# Patient Record
Sex: Female | Born: 1950 | Race: White | Marital: Married
Health system: Northeastern US, Academic
[De-identification: ages and names within clinical notes are randomized; demographics above are authoritative.]

## PROBLEM LIST (undated history)

## (undated) NOTE — Progress Notes (Signed)
Formatting of this note is different from the original.  Images from the original note were not included.  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 Video visit was offered and links provided, but the patient preferred telephone only.     Interval History:  Rachel Mejia is a 31 y.o. female who continues to be followed for a lung cancer resected August 2021. She has completely recovered from surgery. From May to October, Rachel Mejia travelled through [place] ([place], [place] etc[place] and [place]  She did have some shortness of breath at elevation. She is now back in [place] until the spring. She has been well and the only new medical issue has been worsening osteoporosis. She is managed by Dr. Alphonsa Mejia her PCP.     Physical Examination:  There were no vitals taken for this visit.  This was telehealth.    Interval Investigations:    Imaging:     CT Chest 09/17/2022    Impression:   55 y.o. 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     Recommendations:     1. CT Chest in 1 year    I spent more than 20 minutes in counseling and coordination of care, with more than 50% of this time in direct, face to face, discussion with the patient.    Rachel Pitt MD  Thoracic Surgery  Electronically signed by Rachel Bene, MD at 10/05/2022  4:56 PM CDT

---

## 2021-11-25 NOTE — Progress Notes (Signed)
Physical Therapy Treatment Note    Patient Name: Rachel Mejia  UJWJX'B Date: 11/25/2021  Time in: 0753     Time out: 0837   Patient found:  (Received from waiting area)      Patient Effort: Patient Effort: Motivated  Precautions:     Precautions  Other Precautions: L hemiplegia, High fall risk, No sensation on L UE and L LE  Subjective: Pt received from waiting area with no voiced complaints.  She reports compliance with HEP.  Missed visits: 0  Vitals: There were no vitals filed for this visit.  Endurance: Activity Tolerance  Endurance: Endurance does not limit participation in activity  Pain  Pain Assessment  Pain Assessment: 0-10  Pain Score (0-10 Scale): 0 - No pain    Neurological:  Cognitive Status  Overall Cognitive Status: WFL    Gait:  Pt ambulatory in and out of facility with NBQC wearing articulating AFO on L.  She demonstrated good stride length and initial heel strike.     Exercises:  Pt was seen for there ex for improved L LE strength for functional mobility including:  Recumbent cycle L 1 x 7 minutes  STS x 10 without UE assist and x 10 with L LE staggered in back  Four inch step ups x 10 with L LE and B UE assist  Four inch toe taps with L LE 2 x 10  Hip abd with green t-band 3 x 10  Hip adductor ball squeezes 3 x 10  HSC with yellow t-band x 10  Seated hip flexion x 10  LAQs 2 x 10  Pt required verbal cueing for sustained contraction throughout ROM and for eccentric control  Assessment: Pt is highly motivated and maximizes effort.  Pt educated in rationale for various exercises.  She verbalized understanding.  Plan: Continue per POC    Discharge Recommendations:   HEP for self management  Outpatient Medication reconciliation completed? : Yes  Charges: PT Charges  PT Billing Modifiers: Treatment performed 100% by Therapist, no modifier added/needed  $ PT Therapeutic Exercise: 3  Therapist: Caroline More, PT  11/25/21

## 2022-01-03 NOTE — H&P (Signed)
Eudelia, Cahoon    78 Y  old  Female, DOB: October 15, 1951    [address], [place] [address]    Home: [phone]       Provider: Malva Limes, MD         Telephone Encounter          Answered by   Lillie Columbia Date: 05/13/2018       Time: 11:52 AM           Reason  CT neck         Notes    Patient scheduled for CT neck soft tissue w/contrast at [facility] on 05/20/2018 @ 9:15 AM. No solids 4 hrs prior. May have clear liquids up to 2 hrs prior. Will need to have creatinine drawn a few day prior to testing. Creatinine order sent to [facility]. Patient notified.                        Reason for Appointment   1. CT neck            Assessments   1. Blood tests prior to treatment or procedure - Z01.812      Treatment   1. Blood tests prior to treatment or procedure         LAB: Creatinine, Serum or Plasma ([id number]) (Ordered for 05/13/2018)                                Patient: Allisandra, Stonecypher    DOB: 06-Feb-1951    Provider: Malva Limes, MD    05/13/2018      Note generated by eClinicalWorks EMR/PM Software ([email or web address])

## 2022-01-03 NOTE — H&P (Signed)
Rachel Mejia, Rachel Mejia    32 Y  old  Female, DOB: September 13, 1951    [address], [place] [address]    Home: [phone]       Provider: Malva Limes, MD         Telephone Encounter          Answered by   Malachy Moan Date: 05/16/2018       Time: 01:41 PM           Reason  CT scan         Message    Pt called said she was supposed to have a CT set up but has not heard from anyone yet. You can call her back at [phone]         Action Taken    Lillie Columbia  05/17/2018 09:07:20 AM EDT > CT neck soft tissue w/contrast at [facility] on 05/20/2018 @ 9:15 AM                                   Patient: Rachel Mejia, Rachel Mejia    DOB: 03-04-1951    Provider: Malva Limes, MD    05/16/2018      Note generated by eClinicalWorks EMR/PM Software ([email or web address])

## 2022-01-05 NOTE — Progress Notes (Signed)
Physical Therapy Treatment Note    Patient Name: Rachel Mejia  ZOXWR'U Date: 01/05/2022  Time in: 1033     Time out: 1118  Pt ambulatory into clinic with [facility] and L LE AFO   Patient Effort: Motivated  Precautions:  L hemiplegia, High fall risk, No sensation on L UE and L LE  Subjective: Pt notes she has sister in law with her on this date. She denies pain.   Missed visits: 0  Vitals: There were no vitals filed for this visit.     Endurance: Activity Tolerance  Endurance: Endurance does not limit participation in activity  Sitting Balance: Moves/returns trunkal midpoint more than 2 inches in all planes  Pain  Pain Assessment  Pain Assessment: 0-10  Pain Score (0-10 Scale): 0 - No pain  Patient's Comfort Goal: 3  Response to Interventions: Tolerated  Level of Consciousness: Alert    Neurological:  Cognitive Status  Overall Cognitive Status: WFL      Transfers:  Independent from raised surfaces, Requires use of UE from lowered surfaces  Gait:  Requires use of SBQC and L AFO     Exercises:         Seated reach to floor -->Sit<>stand--> OH Ball raise with Red ball 2x10 (from raised mat)  *w/ 1 pd weight on B UE    Standing forward press to LE step w/ red ball 2x10 on each LE   * 4 pd weight on LE     Partial sit ups 2x10 (2 bolsters height)    Ambulation inside of parallel bars with larger obstacles to increase B LE swing height and length   * trials w/ 4 pd weight on B LE (12 trips total)    Standing balance to LE tap on cones for multiple trials with R LE and L LE   *performed with 4 pd ankle weights on B LE   *roughly 2x12 performed on each LE     Standing forward press to LE step w/ red ball 2x10 on each LE   * 4 pd weight on LE     *CGA to min A (with R LE cone taps and L LE on floor for support)  required for ambulation over cones and standing balance with LE taps     Ambulation with 4 pd weight on L LE to improve swing fluidity and increase B swing height for 5 minutes     Equipment Use:  SBQC    Assessment: Pt  tolerated treatment well with no report of pain or adverse events to note. Exercises above were performed to improve functional mobility and decrease burden with transfers. Pt is now ambulating over larger obstacles with increased LE weight demonstrating improved motor control. Pt performed modified sit ups to improve trunk and hip flexor strength to improve gait and ambulation pattern specifically with increasing B LE swing height. Pt continues to require assistance for standing cone taps due to LE weakness. Pt also was able to ambulate in 5 minutes the same distance on 12/18/21, but with 4 pd ankle weights on B LE.  Pt educated on rationale on novel exercises, and pt noted understanding.     Plan: Continue per POC set at last [facility] on 12/18/21.      Discharge Recommendations:   HEP for self-management   Outpatient Medication reconciliation completed? : Yes  Charges: PT Charges  PT Billing Modifiers: Treatment performed 100% by Therapist, no modifier added/needed  $ PT Therapeutic Exercise: 2  $  Pt Therapeutic Activity: 1  Therapist: Nelva Bush, PT  01/05/22

## 2022-03-03 NOTE — Progress Notes (Signed)
Physical Therapy Treatment Note    Patient Name: Rachel Mejia  QIONG'E Date: 03/03/2022  Time in: 1338     Time out: 1424  Pt ambulatory into clinic with [facility] and L LE AFO   Patient Effort: Motivated  Precautions:  L hemiplegia, High fall risk, No sensation on L UE and L LE  Subjective: Pt notes she is ready for her travels. Pt is denies any pain.   Missed visits: 0  Vitals: There were no vitals filed for this visit.     Endurance: Activity Tolerance  Endurance: Endurance does not limit participation in activity  Sitting Balance: Moves/returns trunkal midpoint more than 2 inches in all planes  Pain  Pain Assessment  Pain Assessment: 0-10  Pain Score (0-10 Scale): 0  Patient's Comfort Goal (CFG): 3  Response to Interventions: Tolerated  Level of Consciousness: Alert    Neurological:  Cognitive Status  Overall Cognitive Status: WFL      Transfers:  Independent from raised surfaces, Requires use of UE from lowered surfaces  Gait:  Requires use of SBQC and L AFO     Exercises:      Partial sit ups 2x15 (bolster)  *3 pd weight added     Standing balance on Airex pad w/ shoulder flexion standing balance with eyes closed to Standing marches to Mini squats  2x12/15/12  *with 2.5 pd added to R UE     Standing Hip flexion w/ Green TB 2x10 (2 sets wit PT assisting L LE)    Lateral ambulation inside of parallel bars using Green TB for roughly 15 reps on each LE L/R for 2 sets (CGA provided)    Backward ambulation inside of parallel bars to emphasize TKE for 30 ft   *CGA provided    Ambulation w/ SBQC w/ 3 pd weight on L LE for 5 minutes   *pt ambulated roughly 350 ft  *close supervision provided     Equipment Use:  SBQC    Assessment: Pt tolerated treatment well with no report of pain or adverse events to note. Exercise intensity was increased on this date to improve swing height and length which pt has had noted improvements in since initial evaluation. Backward ambulation was performed  to improve TKE along with challenge balance (decreasing visual field). Pt required up to CGA for backward ambulation due to deficits in balance. Pt educated on novel exercise rationale and pt noted understanding.     Plan: Continue per POC set at last [facility] on 02/10/22.     Discharge Recommendations:   HEP for self-management   Outpatient Medication reconciliation completed? : Yes  Charges: PT Charges  PT Billing Modifiers: Treatment performed 100% by Therapist, no modifier added/needed  $ PT Therapeutic Exercise: 1 (20 min)  $ PT Therapeutic Activity: 1 (13 min)  $ PT Neuro Re-Ed: 1 (13 min)  Therapist: Nelva Bush, PT  03/03/22

## 2022-03-03 NOTE — Unmapped External Note (Signed)
Occupational Therapy Discharge Note  Today's Date: 03/03/2022  Patient Name: Rachel Mejia  [id number]  There is no problem list on file for this patient.    Ordering Physician: Alphonsa Overall , DO    Assesment  Date of Initial Visit: 12/08/2021  Date of Last Visit: 03/03/2022   Number of Attended visits: 7  Summary of Deficits:  Pt is participating with OT services, continues to displays flexor tone, fluctuating from high-moderate. Pt has 1 inch L shoulder subluxation - returned purchase sling and not wearing anything to support affected Shoulder. Pt traveling across country. Stated she will return to therapy in the Fall.  Discharged his date with no goals met     03/03/22 1258   General   OT Treatment Provided Date 03/03/22   Time in 1258   Time Out 1340   Treatment Duration (min) 42 Minutes   Response to Previous Treatment Patient with no complaints from previous session   Family/Caregiver Present No   Pain Assessment   Pain Assessment 0-10   Pain Score (0-10 Scale) 0   Patient's Comfort Goal (CFG) No pain   Special Tests   Functional Assessment Tool Used AMPAC   Score 20/24 38% functional impairment   Cognition   Arousal/Alertness WFL   Plan   Plan Alter current plan   Plan Comments disharge   Patient Left   (handed off to PT)   Recommendation   Treatment Diagnosis CVA LUE affected   Placement Recommendations No services recommended upon discharge   OT - OK to Discharge Yes   OP Med Rec Complete   Outpatient Medication reconciliation completed?  Y   OT Charges   OT Billing Modifiers Treatment performed 100% by Therapist, no modifier added/needed   $ OT Therapeutic Exercise 2   $ OT NeuroMuscular Re-Education 1     Treatment:  OT tx to focus on increased use of LUE pertaining to BADLs and compensatory strategies for increased independence.Pt presented for discussion of continuing or discharging from OT. Pt requested discharge since pt will be out of state  traveling. Therapist instructed pt to continue HEP to promote increase functional use of LEU/decrease tone.   Left Side    - PROMLUE from elbow to distal 10 reps 10 second hold each  - AROM wrist extension with 5 second for 10 reps  - AROM  attempted finger extension for each finger each jt in gravity eliminated position  - Elbow extension, 3x10 - decreased extension  - Elbow flexion 3x 10   - Scapular retracations 2x 10        Charges: OT Charges  OT Billing Modifiers: Treatment performed 100% by Therapist, no modifier added/needed  $ OT Therapeutic Exercise: 2  $ OT NeuroMuscular Re-Education: 1  Plan  Goals:   Goals:  1) Pt will ldemo increased use of LUE for 25% of daily activity per pt report.             Not Met   2) Pt will continue to report 0/10 pain LUE with increased activity.            Not Met  3) Pt will wear resting hand splint through 75% of day per pt report.              Not Met   4) Pt will decrease LUE tone from high to moderate.            Not met  Reasons for Discharge: Pt traveling. Will continue when return    Discharge Recommendations: Outpatient and No services recommended    Therapist: Dessa Phi, OT  03/03/22

## 2022-09-24 NOTE — Unmapped External Note (Signed)
Physical Therapy Evaluation   Patient Name: Rachel Mejia  Ordering Physician: Marya Landry  [id number]  PT Treatment Provided Date: 09/24/22   Today's Date: 09/24/2022  Chart Reviewed: Chart Reviewed: Yes  Time in: 1115    Time out: 1200  History of Present Illness:  Pt presents to the clinic with residual deficits from a CVA 11/29/15 and is being evaluated for a manual wheelchair which she uses for mobility primarily around her home Pt notes she also has ben limited in her mobility since around September 2017 when she fell and broke her hip.  Pt was seen by this PT from 12/22 to 3/30 in which pt did improve in overall functional mobility including short distance ambulation. . Pt is primarily uses her manual wheelchair for mobility >80% of the day throughout her home.  Past Medical History:  Cancer, Stroke, Osteoporosis, Arthritis, HTN   Rehab Potential Assessment: Good  Patient found: Propelled into clinic via manual wheelchair  Patient Effort: Motivated  Family/Caregiver Present: No    Subjective: Pt agreeable to PT evaluation.   Precautions  Other Precautions: Fall Risk   Prior Level of Functioning and Social History:  Prior Function  Level of Independence: Independent with ADLs, Independent with functional transfers, Needs assistance with homemaking  ADL Assistance: Independent  IADL Assistance: Needs assistance  Prior Function Comments: Pt notes she is independent with most ADLs, but requires assistance for IADLs.  Home Setup and Equipment:  PT HOME LIVING  Lives With: Spouse  Type of Home: House  Home Equipment: Wheelchair-manual; Wheelchair-electric; Art gallery manager; Buyer, retail  ADL: Spouse assists with some ADLs, but pt notes she is able to ambulate around the home using her [facility] and complete tasks such as cleaning, cooking, and laundry. Pt is primarily uses her manual wheelchair for mobility >80% of the time.   Vitals: There were no vitals filed for this visit.    Endurance: Activity Tolerance  Endurance:  Endurance does limit participation in activity  Sitting Balance: Moves/returns trunkal midpoint more than 2 inches in all planes  Pain:  Pain Assessment  Pain Assessment: 0-10  Pain Score (0-10 Scale): 0  Patient's Comfort Goal (CFG): 3  Response to Interventions: Tolerated  Level of Consciousness: Alert  Integumentary/Edema: Skin intact as clothed. Pt denies any skin breakdown and has never had pressure ulcers per pt report.   Neurological:  Cognitive Status  Overall Cognitive Status: WFL  Posture:   Moderate lateral trunk to L side in standing with rounded shoulders    Special Tests  TUG w/ SPC (no SBQC available)  Trial 1: 36 secs  5 Minute Walk Test: 125 ft w/ use of SPC  5 STS: 18.5 secs w/ use of UE   MMT & A/PROM:  RUE Strength  RUE Overall Strength: Within Functional Limits - strength 5/5  RUE AROM (degrees)  R Shoulder Flexion  0-170: 180 Degrees  R Shoulder Extension  0-60: 180 Degrees    Strength RLE  R Hip Flexion: 5/5  R Hip Extension: 5/5  R Hip ABduction: 5/5  R Hip ADduction: 5/5  R Knee Flexion: 5/5  R Knee Extension: 5/5  R Ankle Dorsiflexion: 5/5  R Ankle Plantar Flexion: 5/5    Strength LLE  L Hip Flexion: 3+/5  L Hip Extension: 3+/5  L Hip ABduction: 3+/5  L Hip ADduction: 3+/5  L Knee Flexion: 2+/5  L Knee Extension: 2+/5  L Ankle Dorsiflexion: 1/5  L Ankle Plantar Flexion: 2/5    LUE  AROM (degrees)  L Shoulder Flexion  0-170: 35 Degrees  L Shoulder ABduction 0-40: 35 Degrees  LUE Strength  L Shoulder Flexion: 3-/5  L Shoulder Extension: 3-/5  L Shoulder ABduction: 3-/5  L Shoulder Internal Rotation: 2/5  L Shoulder External Rotation: 2/5    Grip Strength  L: unable to grip due to contractures  R: 55 pounds     Bed Mobility:  Independent  Transfers:  Increased time taken, UE support required from low surfaces   Wheelchair Activities: Pt independently propels manual wheelchair using R UE and R LE   Gait: Step-to gait pattern using SPC (high fall risk)           Assessment:   Treatment Diagnosis:  History of Cerebrovascular Accident  Placement Recommendations: Outpatient (for improvement of current physical function)  Prognosis: Good  Problem List: Decreased strength; Impaired balance; Decreased endurance; Decreased range of motion; Decreased mobility; Impaired tone  Problem List Comments: Multiple problems are impairing pt's function.  Education: Pt educated on skilled therapy POC, special tests rationale, evaluation findings and manual wheelchair recommendations discussed with DME representative.    Plan  Pt is currently warranted a manual wheelchair to address her functional mobility deficits and decrease burden with ADLs. Specific recommendation listed below.    Recommendation:  Equipment Recommended:   A manual wheelchair with the following modifications are recommended to improve mobility, improve independence with self-propulsion to decrease caregiver burden, and decrease risk of falls  *One arm drive to decrease burden with self-propulsion  *Push to lock brakes  *Quick release tires  *Adjustable t-post  *Airless inserts  *5 in. Casters   *4-way inline  *Heel coops w/ a 8x6.5 footplate  *24 in mag push rims    Frequency of PT: One time evaluation for manual wheelchair  Discharge Disposition: Pt to receive a new manual wheelchair  Outpatient Medication reconciliation completed? : Yes  Charges: PT Charges  PT Billing Modifiers: Treatment performed 100% by Therapist, no modifier added/needed  $ PT Eval - Moderate : 1 Procedure  Therapist: Nelva Bush, PT  09/24/22    I certify that the above therapy services are being furnished while the patient is under my care. I agree with the treatment plan and certify that this therapy is necessary.      Physician Name:______________________________  NPI:______________________    Physician Signature: _____________________________        Date/Time: ___________

## 2023-02-02 NOTE — Assessment & Plan Note (Addendum)
As seen on recent surveillance colonoscopy  Recommend high fiber diet  Supplementary daily Metamucil  GI follow-up as needed

## 2023-03-31 NOTE — Progress Notes (Signed)
Patient's Care Team  Primary Care Provider: Silvana Newness MD: [address], [place], [place] [address], Ph [phone], Fax [phone] NPI: [id number]   Patient's Pharmacies  [id number]): [address], [place], [place] [address], Ph [phone], Fax [phone]   Vitals    Ht: 5 ft 7 in 09/21/2022 08:25 am Wt: 165.4 lbs With clothes 09/21/2022 08:25 am BMI: 25.9 09/21/2022 08:25 am   BP: 150/80 sitting L arm 09/21/2022 08:27 am T: 98.3 F oral 09/21/2022 08:28 am O2Sat: 94% Room Air at Rest 09/21/2022 08:28 am   RR: 16 09/21/2022 08:28 am Pulse: 76 bpm regular 09/21/2022 08:28 am Body Surface Area: 1.88 m 09/21/2022 08:25 am   Allergies    Reviewed Allergies   AMOXICILLIN, high criticality: Hives    ASPIRIN, high criticality: Anaphylaxis    EGG, high criticality    Medications    Reviewed Medications    amLODIPine 10 mg tablet  Take 1 tablet(s) every day by oral route in the morning for 90 days. 03/04/22   prescribed AUSTIN SORCHIK, DO   atorvastatin 10 mg tablet  Take 1 tablet(s) every day by oral route at bedtime. 09/21/22   prescribed AUSTIN SORCHIK, DO   Calcium 600  1 po bid 09/15/21   entered [facility] Knife   Cipro 250 mg tablet  Take 1 tablet(s) as needed by oral route.  Internal Note: after intercourse 09/15/21   entered [facility] Knife   Citracal + D Maximum  1 po daily  Internal Note: citracal630/vitamin d 12. 03/16/22   entered [facility] Knife   clopidogreL 75 mg tablet  TAKE 1 TABLET po qod 09/15/21   entered [facility]ife   diazePAM 5 mg tablet  Take 1 tablet(s) every day by oral route as needed. 01/21/22   prescribed AUSTIN SORCHIK, DO   losartan 100 mg tablet  Take 1 tablet(s) every day by oral route at bedtime for 90 days. 01/12/22   prescribed AUSTIN SORCHIK, DO   melatonin 3 mg tablet  Take 1 tablet(s) as needed by oral route. 09/15/21   entered [facility] Knife   Prolia 60 mg/mL subcutaneous syringe  Inject 1 mL by subcutaneous route.  Internal Note: q 6 months 03/16/22   entered [facility] Knife   Vitamin D3  1 po daily  Internal Note: 800mg  09/15/21   entered [facility] Knife   Vaccines  Reviewed Vaccines  Vaccine Type Date Amt. Route Site NDC Lot # Mfr. Exp.  Date VIS VIS  Given Vaccinator   COVID-19   COVID-19, mRNA, LNP-S, PF, 30 mcg/0.3 mL dose (Pfizer-BioNTech) 05/07/20      Pfizer, [employer] gen   Diphtheria, Tetanus, Pertussis   Tdap 03/29/18             Influenza   influenza, recombinant, quadrIvalent,injectable, preservative free 10/27/21     [id number]        Pneumococcal   Pneumococcal conjugate PCV20, polysaccharide [id number] conjugate, adjuvant, PF 04/16/20               Problems  Reviewed Problems  Body mass index 25-29 - overweight - Onset: 09/15/2021  Restless legs - Onset: 09/15/2021  Essential hypertension - Onset: 09/15/2021  Recurrent urinary tract infection - Onset: 09/15/2021  Osteoporosis - Onset: 09/15/2021  History of malignant neoplasm of lung - Onset: 09/15/2021  History of cerebrovascular accident - Onset: 09/15/2021  History of right total knee replacement - Onset: 09/15/2021  Multiple nodules of lung -  Onset: 09/15/2021    Family History  Reviewed Family History  Mother - Type 2 diabetes mellitus (died age: 4)   Father - Myocardial infarction     - Malignant tumor of lung (died age: 13)   Brother - Heart disease   Social History  Reviewed Social History   Substance Use  Do you or have you ever smoked tobacco?: Former smoker  How many years have you smoked tobacco?: 40  At what age did you start smoking tobacco?: 19  Do you or have you ever used any other forms of tobacco or nicotine?: No  What was the date of your most recent tobacco screening?: 03/16/2022  What is your level of alcohol consumption?: Occasional  How many years have you consumed alcohol?: 2  Do you use any illicit or recreational drugs?: No  What is your level of caffeine consumption?: Moderate  Marriage and Sexuality  What is your relationship status?: Married  How many children do you have?: 1  Medicare Annual  Wellness Visit  Are you a smoker?: No  Are you afraid of falling?: Yes  Are you having difficulties driving your car?: No  Can you do your own housework without help?: Yes  Can you get places out of walking distance without help? For example, can you travel alone by bus, taxi, or drive your own car?: Yes  Can you handle your own money without help?: Yes  Can you prepare your own meals?: Yes  Can you shop for groceries or clothes without help?: Yes  Do you always fasten your seat belt when you are in a car?: Yes usually  Do you exercise for about 20 minutes 3 or more days a week?: Yes some of the time  DO you need help eating, bathing, dressing, or getting around your home?: No  During the past 4 weeks how many drinks of wine, beer, or other alcoholic beverages did you have?: No alcohol at all  During the past 4 weeks, has your physical and emotional health limited your social activities with family friends, neighbors or groups?: Not at all  During the past 4 weeks, how much bodily pain have you generally had?: no pain  During the past 4 weeks, how much time have you been bothered by emotional problems such as feeling anxious, depressed, irritable, sad or downhearted and blue?: Not at all  During the past 4 weeks, how would you rate your health in general?: Good  During the past 4 weeks, was someone available to help you if you needed and wanted help? For example, if you felt very nervous, lonely or blue, got sick and had to stay in bed, needed someone to talk to, needed help with daily chores, or needed help just taking care of yourself.: Yes as much as I wanted  During the past 4 weeks, what was the hardest physical activity you could do for at least 2 minutes: Very light  Have you been given any information to help you with hazards in your house that might hurt you?: No  Have you been given any information to help you with keeping track of your medications?: Yes  Have you fallen 2 or more times in the past year?:  No  How confident are you that you can control and manage most of your health problems: Not very confident  How have things been going for you during the past 4 weeks?: Pretty Good  How often do you have trouble taking medicines the way  you have been told to take them?: I always take them as prescribed  In the past 4 weeks have you been tired or fatigued?: Never  In the past 4 weeks have you had problems using the telephone?: Never  In the past 4 weeks have you had teeth or denture problems: Never  In the past 4 weeks have you: fallen or been dizzy when standing up?: Never    Surgical History  Reviewed Surgical History  Procedure on lung - right  Tubal ligation  Breast procedure - right  Tonsillectomy  Colonoscopy - 12/07/2017  Procedure on brain - 12/08/2015  Total knee replacement - 12/07/2014  Insertion of stent into subclavian artery - 12/07/2013  Cholecystectomy - 12/08/2007  Procedure on wrist - 12/07/2005 - right  Urethropexy - 12/08/1991    GYN History  Reviewed GYN History  Date of Last Mammogram: 06/04/2021.  Date of Last Pap Smear: 11/17/2017.  Mammogram Result: Normal.  Obstetric History  Reviewed Obstetric History   Past Medical History  Reviewed Past Medical History   HPI  Routine 6 month f/u and lab review     UA negative  CBC normal  BMP normal  Cholesterol well controlled  Thyroid panel normal  Negative proteinuria  All labs look fantastic     Due for mammogram and colonoscopy     No URI symptoms, no home contacts with URI symptoms.  Denies Fatigue, SOB, Palpitations/Chest pain, N/V/D, F/C, Loss of smell or taste.  ROS negative unless specified in HPI  ROS  ROS as noted in the HPI  Physical Exam  Constitutional: General Appearance: healthy-appearing, well-developed, and well-nourished. Level of Distress: NAD.     Head: Head: normal and atraumatic.     Eyes: Pupils: PERRLA, extraocular movements intact, and no conjunctival erythema.     Ears, Nose, Throat: Ears: normal hearing and tempatic membranes  pearly with intact light reflex. Nose: normal turbinates and nasal mucosa and no nasal dicharge. Mouth good oral hygiene and no oral mucosal lesions or inflammation. Throat no oropharyngeal lesions or inflammation or tonsillar lesions or inflammation and uvula normal. Right ear normal right canal and TM. Left Ear normal left canal and TM.     Neck: Appearance supple, full range of motion, and no lymphadenopathy. Thyroid: no enlargement or nodules and non-tender and regular contour. Carotid bruits no.     Lungs / Chest: Respiratory effort: unlabored, speaking in full sentences, and good air movement. Auscultation: no wheezing, rhonchi, or rales / crackles and normal breath sounds.     Cardiovascular System: Rate and Rhythm: regular. Heart Sounds: no murmurs.     Abdomen: Bowel Sounds: normal. Inspection and Palpation: no tenderness, guarding, masses, hepato-splenomegaly, rebound tenderness, or CVA tenderness and soft and non-distended.     Extremities: Extremities no clubbing, cyanosis, or edema; Weakness decreased range of motion left upper and left lower extremity. Extremity Edema Right: none. Extremity Edema Left: none.     Musculoskeletal:: General Musculoskeletal: no joint, muscle, or bone tenderness and ambulate with cane:.     Skin: General Skin: no rash or jaundice.     Mental Status Exam:: Orientation oriented to person, place, problem, and time and alert and oriented. Affect: appropriate affect. Mood: appropriate mood. Thought Process and Content: normal insight and attention and good judgement and ordered thought process.  Assessment / Plan  1. Essential hypertension -  Managed on Losartan 100 PRN  Amlodipine 10  I10: Essential (primary) hypertension  LEARNING ABOUT HIGH BLOOD PRESSURE  2. History of cerebrovascular accident -  Managed on atorvastatin 10     Clopidogrel 75 every other day     Deficits are L sided weakness, LUE and RLE  Ambulating with cane     Stent [place] subclavian due annually in  september 2024  Z86.73: Personal history of transient ischemic attack (TIA), and cerebral infarction without residual deficits  atorvastatin 10 mg tablet -   Take 1 tablet(s) every day by oral route at bedtime.     Qty: (90)  tablet     Refills: 2     Pharmacy: [id number]     3. Body mass index 25-29 - overweight  Z68.25: Body mass index [BMI] 25.0-25.9, adult  A HEALTHY LIFESTYLE: CARE INSTRUCTIONS  LEARNING ABOUT HEALTHY WEIGHT     4. History of malignant neoplasm of lung -  R Middle lobectomy last year.  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 Multiple nodules of lung -  Noted on recent CT, no prior imaging to compare to.  History of R Middle lobectomy  R91.8: Other nonspecific abnormal finding of lung field     6. Screening for malignant neoplasm of breast  Z12.39: Encounter for other screening for malignant neoplasm of breast  LEARNING ABOUT BREAST CANCER SCREENING  MAMMO, SCREENING, BILATERAL     7. Screening for malignant neoplasm of colon  Z12.11: Encounter for screening for malignant neoplasm of colon  GASTROENTEROLOGIST REFERRAL -       Schedule Within: provider's discretion     8. Restless legs -  Valium 5mg  as needed, maybe twice a month.  G25.81: Restless legs syndrome  RESTLESS LEGS SYNDROME: CARE INSTRUCTIONS     9. Osteoporosis -  Follows with Dr. Tammy Sours  On Prolia  M81.0: Age-related osteoporosis without current pathological fracture  OSTEOPOROSIS: CARE INSTRUCTIONS       Return to Office   Terie Purser for [facility](Medicare) at [facility] on 02/10/2023 at 09:20 AM      Encounter Sign-Off  Encounter signed-off by Silvana Newness, DO, 09/21/2022.  Encounter performed and documented by Silvana Newness, DO

## 2023-06-11 ENCOUNTER — Emergency Department: Payer: Medicare Other

## 2023-06-11 ENCOUNTER — Other Ambulatory Visit: Payer: Self-pay

## 2023-06-11 ENCOUNTER — Ambulatory Visit: Payer: Self-pay

## 2023-06-11 ENCOUNTER — Emergency Department
Admission: EM | Admit: 2023-06-11 | Discharge: 2023-06-11 | Disposition: A | Discharge status: Home / Self Care | Payer: Medicare Other | Source: Ambulatory Visit | Attending: Emergency Medicine | Admitting: Emergency Medicine

## 2023-06-11 DIAGNOSIS — B028 Zoster with other complications: Secondary | ICD-10-CM | POA: Insufficient documentation

## 2023-06-11 LAB — PLASMA PROF 7 (ED ONLY)
Anion Gap,PL: 15 (ref 7–16)
CO2,Plasma: 22 mmol/L (ref 20–28)
Chloride,Plasma: 101 mmol/L (ref 96–108)
Creatinine: 0.82 mg/dL (ref 0.51–0.95)
Glucose,Plasma: 120 mg/dL — ABNORMAL HIGH (ref 60–99)
Sodium,Plasma: 138 mmol/L (ref 133–145)
UN,Plasma: 15 mg/dL (ref 6–20)
eGFR BY CREAT: 76 *

## 2023-06-11 LAB — CBC AND DIFFERENTIAL
Baso # K/uL: 0 10*3/uL (ref 0.0–0.2)
Eos # K/uL: 0 10*3/uL (ref 0.0–0.5)
Hematocrit: 46 % (ref 34–49)
Hemoglobin: 15.2 g/dL (ref 11.2–16.0)
IMM Granulocytes #: 0 10*3/uL (ref 0.0–0.0)
IMM Granulocytes: 0.3 %
Lymph # K/uL: 0.9 10*3/uL — ABNORMAL LOW (ref 1.0–5.0)
MCV: 90 fL (ref 75–100)
Mono # K/uL: 0.7 10*3/uL (ref 0.1–1.0)
Neut # K/uL: 5.2 10*3/uL (ref 1.5–6.5)
Nucl RBC # K/uL: 0 10*3/uL (ref 0.0–0.0)
Nucl RBC %: 0 /100 WBC (ref 0.0–0.2)
Platelets: 165 10*3/uL (ref 150–450)
RBC: 5.2 MIL/uL (ref 4.0–5.5)
RDW: 13.5 % (ref 0.0–15.0)
Seg Neut %: 76 %
WBC: 6.8 10*3/uL (ref 3.5–11.0)

## 2023-06-11 LAB — POTASSIUM

## 2023-06-11 LAB — HOLD SST

## 2023-06-11 LAB — HEMOLYZED TEST

## 2023-06-11 LAB — HEMOLYZED SPECIMEN (GREEN TOP)

## 2023-06-11 MED ORDER — DEXTROSE 5 % FLUSH FOR PUMPS *I*
0.0000 mL/h | INTRAVENOUS | Status: DC | PRN
Start: 2023-06-11 — End: 2023-06-12

## 2023-06-11 MED ORDER — VALACYCLOVIR HCL 1000 MG PO TABS *I*
1000.0000 mg | ORAL_TABLET | Freq: Three times a day (TID) | ORAL | 0 refills | Status: AC
Start: 2023-06-11 — End: 2023-06-21

## 2023-06-11 MED ORDER — BACITRACIN-POLYMYXIN B 500-10000 UNIT/GM OP OINT *I*
TOPICAL_OINTMENT | Freq: Every evening | OPHTHALMIC | 0 refills | Status: AC
Start: 2023-06-11 — End: ?

## 2023-06-11 MED ORDER — ACETAMINOPHEN 325 MG PO TABS *I*
975.0000 mg | ORAL_TABLET | Freq: Once | ORAL | Status: AC
Start: 2023-06-11 — End: 2023-06-11
  Administered 2023-06-11: 975 mg via ORAL
  Filled 2023-06-11: qty 3

## 2023-06-11 MED ORDER — KETOROLAC TROMETHAMINE 30 MG/ML IJ SOLN *I*
15.0000 mg | Freq: Once | INTRAMUSCULAR | Status: AC
Start: 2023-06-11 — End: 2023-06-11
  Administered 2023-06-11: 15 mg via INTRAVENOUS
  Filled 2023-06-11: qty 1

## 2023-06-11 MED ORDER — SODIUM CHLORIDE 0.9 % FLUSH FOR PUMPS *I*
0.0000 mL/h | INTRAVENOUS | Status: DC | PRN
Start: 2023-06-11 — End: 2023-06-12

## 2023-06-11 MED ORDER — CARBOXYMETHYLCELLULOSE SODIUM 0.5 % OP SOLN WRAPPED *I*
1.0000 [drp] | Freq: Three times a day (TID) | OPHTHALMIC | 0 refills | Status: AC | PRN
Start: 2023-06-11 — End: ?

## 2023-06-11 MED ORDER — VALACYCLOVIR HCL 500 MG PO TABS *I*
1000.0000 mg | ORAL_TABLET | Freq: Once | ORAL | Status: AC
Start: 2023-06-11 — End: 2023-06-11
  Administered 2023-06-11: 1000 mg via ORAL
  Filled 2023-06-11: qty 2

## 2023-06-11 MED ORDER — HYDROMORPHONE HCL PF 1 MG/ML IJ SOLN *WRAPPED*
0.5000 mg | Freq: Once | INTRAMUSCULAR | Status: AC
Start: 2023-06-11 — End: 2023-06-11
  Administered 2023-06-11: 0.5 mg via INTRAVENOUS
  Filled 2023-06-11: qty 0.5

## 2023-06-11 MED ORDER — ACYCLOVIR 800 MG PO TABS *I*
800.0000 mg | ORAL_TABLET | Freq: Every day | ORAL | 0 refills | Status: DC
Start: 2023-06-11 — End: 2023-06-11
  Filled 2023-06-11: qty 35, 7d supply, fill #0

## 2023-06-11 NOTE — ED Triage Notes (Signed)
Patient with new contacts and glasses 2 days ago.  Had headache the next day and attributed to new eye ware but slept for a whole day which is unlike her. Went to walk in clinic today with minimal eye swelling on right eye. While at clinic eye became quite edematous and redness spread up onto forehead past hairline. Patient denies vision changes, lightheaded/dizziness.  Still endorsing headache.  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

## 2023-06-11 NOTE — First Provider Contact (Addendum)
ED First Provider Contact Note    Initial provider evaluation performed by   ED First Provider Contact       Date/Time Event User Comments    06/11/23 1040 ED First Provider Contact Amoree Newlon Initial Face to Face Provider Contact            Vital signs reviewed.    Assessment: Patient is a 72yo female who presents to the ED with complaints of left sided eye pain, left eye lid swelling and erythema which is extending into the scalp area this morning. Patient recently had new contacts in 2 days ago as well. Denies fever/chills. + headache. Denies visual changes or photosensitivity    Orders placed:  LABS     Patient requires further evaluation.     Zack Seal, NP, 06/11/2023, 10:40 AM     Zack Seal, NP  06/11/23 1042       Zack Seal, NP  06/11/23 1043

## 2023-06-11 NOTE — ED Notes (Signed)
Pt discharged per provider. Discharge instructions reviewed, pt verbalized understanding. IV removed. VSS. A&Ox4. Pt left ED ambulatory independently with all personal belongings and access to residence.

## 2023-06-11 NOTE — Provider Consult (Addendum)
Ophthalmology Consult Note      Patient name: Rachel Mejia  DOB: [birth year]       Age: 72 y.o.  MR#: [id number]    Date: 06/11/2023    Consulting team: ED   Reason for consult: Concern for herpes zoster ophthalmicus    HPI: HPI    Rachel Mejia is a 72 y.o. year old female with POH of pathologic myopia   who presented to [facility] ED after referral from urgent care for concern for   herpes zoster with ophthalmology consulted for concern for HZO.    Per patient, got a new glasses prescription 2 days ago and noted headache   she attributed to new prescription. Presented to urgent care today during   which time she developed progressive right facial swelling in V1   distribution and was referred to the [facility] ED.    Denies any vision changes nor flashes, floaters, or a curtain falling.   Reports pathologic myopia (reports prescription around -12.5 diopters) but   otherwise no known POH, surgery, or trauma.    Last edited by Charm Barges, MD on 06/11/2023 11:30 PM.          Current Facility-Administered Medications:     sodium chloride 0.9 % FLUSH REQUIRED IF PATIENT HAS IV, 0-500 mL/hr, Intravenous, PRN, Springer, Heidi M, NP    dextrose 5 % FLUSH REQUIRED IF PATIENT HAS IV, 0-500 mL/hr, Intravenous, PRN, Springer, Newell Coral, NP    Current Outpatient Medications:     valACYclovir (VALTREX) 1 gm tablet, Take 1 tablet (1,000 mg total) by mouth 3 times daily for 10 days., Disp: 30 tablet, Rfl: 0    carboxymethylcellulose (REFRESH PLUS) 0.5 % ophthalmic solution, Place 1 drop into the right eye 3 times daily as needed., Disp: 30 each, Rfl: 0    bacitracin-polymyxin b (POLYSPORIN) ophthalmic ointment, Place into the right eye nightly for Conjunctivitis., Disp: 3.5 g, Rfl: 0  Allergies: Patient has no allergy information on record.     Medical History: History reviewed. No pertinent past medical history.     Surgical History: History reviewed. No pertinent surgical history.     ROS    Positive for: Eyes  Last edited by  Charm Barges, MD on 06/11/2023 11:27 PM.      Objective:     Base Eye Exam       Visual Acuity (Snellen - Linear)         Right Left    Near cc J2- J1+      Correction: Glasses              Tonometry (Tonopen, 2:36 PM)         Right Left    Pressure 16 17              Pupils         Dark Light Shape React APD    Right 3 2.5 Round Brisk None    Left 3 2.5 Round Brisk None   Limited by direct bright over head ED lights             Neuro/Psych       Oriented x3: Yes    Mood/Affect: Normal              Dilation       Both eyes: 2.5% Phenylephrine, 1.0% Tropicamide, 0.5% Proparacaine @ 2:36 PM  Slit Lamp and Fundus Exam       External Exam         Right Left    External Erythema and crusted-appearing rash over V1 distribution               Slit Lamp Exam         Right Left    Lids/Lashes Upper lid edema and erythema Upper lid edema and erythema    Conjunctiva/Sclera DIffuse injection DIffuse injection    Cornea Single inferior PEE, no pseudodendrites, no epi defects Single inferior PEE, no pseudodendrites, no epi defects    Anterior Chamber Clear & deep Clear & deep    Iris Normal shape, size, morphology Normal shape, size, morphology    Lens 2+ NS 2+ NS    Anterior Vitreous Clear Clear              Fundus Exam         Right Left    Disc Myopic degeneration Myopic degeneration    Macula Blonde fundus Blonde fundus    Vessels Normal Normal    Periphery Pigment degeneration, peripheral lattice, but no retinal tears, breaks, detachments, necrosis Pigment degeneration, peripheral lattice, but no retinal tears, breaks, detachments, necrosis                          Assessment/Plan:     Rachel Mejia is a 72 y.o. year old female with POH of pathologic myopia who presented to [facility] ED after referral from urgent care for concern for herpes zoster with ophthalmology consulted for concern for HZO.    Exam notable for facial edema and erythema as well as characteristic crusted rash in V1 distribution with diffuse  conjunctival injection and a single PEE but without corneal pseudodendrites, anterior chamber reaction to suggest uveitis or elevated IOP to indicate uveitic glaucoma, nor dilated fundus exam findings concerning for zoster-associated acute retinal necrosis. Exam significant for stable-appearing myopic degeneration consistent with patient report of pathologic myopia.    Recommend topical and systemic medical management and follow up early next week in outpatient clinic.    Recommendations:     #Herpes zoster in RIGHT V1 distribution   - cool compresses for comfort  - Valtrex 1g TID x 10 days (renal dosing per primary and pharmacy as needed)  - Preservative free artificial tear drops (ex. Refresh) PRN  - AK poly bac ointment QHS   - will make vision blurry, administer before bedtime  - Preservative free artificial tears drops (ex. "Refresh")PRN for comfort  - Follow up early next week with comprehensive ophthalmology or outside ophthalmologist if preferred  -we will arrange follow up but please include ophthalmology clinic number ([phone]) in discharge paperwork to close loop.   - Return precautions discussed with patient and please include in discharge instructions: If you begin to experience new eye pain, eye redness, floaters, flashes of light, a black curtain in part of the affected eye, loss of vision or worsening of current symptoms please call the eye clinic at any time at [phone]      Please page/call ophthalmology [facility]" on call) with any questions or concerns. Thank for your allowing [place] to participate in the care of this patient.    Charm Barges, MD  Ophthalmology PGY-2  06/11/23 11:32 PM    Patient seen and examined and plan formulated with ophthalmology attending Dr. Fayrene Helper, please see attestation    Please note that I saw this  patient on 06/11/23 along with Dr.Brittany Mod[place] Charon and I agree with the plan.

## 2023-06-11 NOTE — ED Notes (Signed)
Per Caller Heidi from [facility], patient today had large cellulitis infection on scalp spread to right peri-orbital area with lethargy, has headache and eye pain

## 2023-06-11 NOTE — ED Notes (Signed)
Report Given To  Irving Burton, RN       Descriptive Sentence / Reason for Admission   Patient with new contacts and glasses 2 days ago. Had headache the next day and attributed to new eye ware but slept for a whole day which is unlike her. Went to walk in clinic today with minimal eye swelling on right eye. While at clinic eye became quite edematous and redness spread up onto forehead past hairline. Patient denies vision changes, lightheaded/dizziness. Still endorsing headache.       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

## 2023-06-11 NOTE — Plan of Care (Signed)
Ophthalmology Brief Plan of Care Note    Please be aware that Rachel Mejia [id number] had their eyes dilated with  2.5% phenylephrine, 1.0% tropicamide, and 0.5% proparacaine at approximately 2:37 PM after discussion with primary team. Prior to dilation, pupils equal, round, briskly-reactive, without RAPD.    Please note that after pharmacologic dilation, pupils will be dilated and minimally-reactive, near vision (ex. Reading distance) will be blurry, and eyes will be sensitive to light for several hours.     Ophthalmology consult team to return to complete dilated eye exam.    Lennart Pall. Mod[place] Charon, MD  Ophthalmology PGY1  06/11/23 2:37 PM

## 2023-06-11 NOTE — ED Provider Notes (Signed)
History     Chief Complaint   Patient presents with    Eye Problem    Headache     72 year old female presents to the emergency department with right eye pain.  Patient reports that she got a new prescription and glasses 2 days ago.  The next day she started experiencing a headache which she initially attributed to the new prescription.  Patient continued to have pain in this area as well as body aches and malaise.  Patient went into the walk-in eye clinic for further evaluation today where they noted that her eye was quite edematous and the redness was spreading up her forehead prompting referral here to the emergency department.  Patient denies any change in vision or trauma to the eye.  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  Patient did not receive her shingles vaccinations        Medical/Surgical/Family History     No past medical history on file.     There is no problem list on file for this patient.           No past surgical history on file.                  Review of Systems    Physical Exam     Triage Vitals  Triage Start: Start, (06/11/23 1039)  First Recorded Oxygen Therapy O2 Device: None (Room air),    .      Physical Exam  Constitutional:       General: She is not in acute distress.     Appearance: Normal appearance.   HENT:      Head: Normocephalic and atraumatic.      Nose: Nose normal. No congestion or rhinorrhea.      Mouth/Throat:      Mouth: Mucous membranes are moist.   Eyes:      General:         Right eye: Discharge present.      Conjunctiva/sclera:      Right eye: Right conjunctiva is injected.   Cardiovascular:      Rate and Rhythm: Normal rate and regular rhythm.      Heart sounds: No murmur heard.     No friction rub. No gallop.   Pulmonary:      Effort: Pulmonary effort is normal. No respiratory distress.      Breath sounds: No wheezing or rales.   Abdominal:      General: Abdomen is flat. Bowel sounds are normal. There is no distension.       Palpations: Abdomen is soft.      Tenderness: There is no abdominal tenderness.   Musculoskeletal:         General: No swelling. Normal range of motion.      Cervical back: Normal range of motion.   Skin:     General: Skin is warm and dry.      Capillary Refill: Capillary refill takes less than 2 seconds.      Findings: Erythema and rash present. Rash is vesicular (vesicular rash extending from upper eyelid superiorly into hair line).   Neurological:      General: No focal deficit present.      Mental Status: She is alert and oriented to person, place, and time.      Sensory: No sensory deficit.      Motor: No weakness.   Psychiatric:         Mood and Affect:  Mood normal.         Behavior: Behavior normal.               Medical Decision Making     Assessment:  72 year old female with prior medical history as mentioned above presents emergency department with right eye pain.  At time assessment patient is laying in her bed incredibly uncomfortable but in no acute distress.  Patient's vitals are normal.  At this time based on patient's physical exam is clearly consistent with a shingles infection.  Based on location there is concern for possible eye involvement.  Patient will require ophthalmology evaluation .  Other considerations would be a corneal abrasion or foreign body.  These things would not account for patient's vesicular lesions on her forehead.    Differential diagnosis:  Shingles, herpes zoster ophthalmicus, corneal abrasion, foreign body    Plan:  Orders Placed This Encounter      CBC and differential      Plasma profile [facility] ED only)      Hold SST      Hemolyzed specimen, green top      Potassium      Hemolyzed test      Insert peripheral IV      sodium chloride 0.9 % FLUSH REQUIRED IF PATIENT HAS IV      dextrose 5 % FLUSH REQUIRED IF PATIENT HAS IV      HYDROmorphone PF (DILAUDID) injection 0.5 mg      ketorolac (TORADOL) 30 mg/mL injection 15 mg      acetaminophen (TYLENOL) tablet 975 mg       valACYclovir (VALTREX) tablet 1,000 mg      acyclovir (ZOVIRAX) 800 MG tablet        ED Course and Disposition:  Patient has remained stable while in the emergency department.  Acyclovir ordered.  Ophthalmology team was consulted and patient's eye has been dilated for evaluation.  At this time patient signed out to oncoming team pending ophthalmology recommendations.  Patient will require outpatient antiviral treatment.            Gust Rung, DO            Gust Rung, DO  Resident  06/14/23 [phone]

## 2023-06-11 NOTE — Discharge Instructions (Addendum)
You are seen in the emergency department today and found to have shingles.  You were assessed by ophthalmology to check for eye involvement.  This is reassuring at this time and eye drops were sent to the pharmacy. Please follow up with your ophthalmologist in approximately 1 week or call the eye clinic at [phone].  f you begin to experience new eye pain, eye redness, floaters, flashes of light, a black curtain in part of the affected eye, loss of vision or worsening of current symptoms please return to the emergency room.      You are being sent with antiviral's.  Please take these as prescribed in their entirety.    you can prevent spreading the virus to other people if you:   Keep your rash covered.   Wash your hands often until your rash has scabbed over.  When should I call the doctor? -- Call your doctor or nurse right away if you think that you might have shingles. The sooner you can start treatment, the better.  If you are already being treated for shingles, call your doctor or nurse if:   Your pain gets worse and is not helped by over-the-counter medicines.   You have increased redness or swelling around your rash.   You get a fever.   You have eye symptoms like redness, irritation, or vision problems.   You have ear symptoms like pain or trouble hearing.

## 2023-06-12 NOTE — ED Provider Progress Notes (Signed)
ED Provider Progress Note    Briefly, patient is a 72yo female who presents to the ED with complaints of left sided eye pain, left eye lid swelling and erythema which is extending into the scalp area this morning. This is likely shingles based on presentation, and ophthalmology was consulted for concerns of eye involvement. Patient was signed out to me pending ophthalmology recommendations.     Patient assessed after sign out and states her pain was increasing, and she was given another 0.5mg  dilaudid. Patient's eye dilated by ophthalmology. Patient cleared by ophthalmology and prescriptions for valtrex, artifical tears, and AK poly bac ointment was prescribed to the patient per ophthalmology recommendations. Patient was given strict return precautions and was instruct to follow up at the eye clinic or an ophthalmologist in one week.               Caralyn Guile, DO, 06/12/2023, 2:47 PM     Caralyn Guile, DO  Resident  06/12/23 [phone]

## 2023-06-14 ENCOUNTER — Telehealth: Payer: Self-pay

## 2023-06-14 NOTE — Telephone Encounter (Signed)
Left VM scheduling with Dr Nedra Hai on 7/9 at 1:15pm in [email or web address]

## 2023-06-15 ENCOUNTER — Ambulatory Visit: Payer: Medicare Other | Admitting: Ophthalmology

## 2023-06-16 ENCOUNTER — Encounter: Payer: Self-pay | Admitting: Emergency Medicine

## 8387-08-08 DEATH — deceased
